# Patient Record
Sex: Male | Born: 1990 | Race: White | Hispanic: No | Marital: Single | Smoking: Former smoker
Health system: Southern US, Community
[De-identification: ages and names within clinical notes are randomized; demographics above are authoritative.]

## PROBLEM LIST (undated history)

## (undated) DIAGNOSIS — T7840XA Allergy, unspecified, initial encounter: Secondary | ICD-10-CM

## (undated) HISTORY — DX: Allergy, unspecified, initial encounter: T78.40XA

---

## 2011-06-25 ENCOUNTER — Encounter (HOSPITAL_COMMUNITY): Payer: Self-pay | Admitting: *Deleted

## 2011-06-25 ENCOUNTER — Emergency Department (HOSPITAL_COMMUNITY)
Admission: EM | Admit: 2011-06-25 | Discharge: 2011-06-25 | Disposition: A | Discharge status: Home / Self Care | Payer: 59 | Attending: Emergency Medicine | Admitting: Emergency Medicine

## 2011-06-25 DIAGNOSIS — R197 Diarrhea, unspecified: Secondary | ICD-10-CM | POA: Insufficient documentation

## 2011-06-25 DIAGNOSIS — R109 Unspecified abdominal pain: Secondary | ICD-10-CM | POA: Insufficient documentation

## 2011-06-25 LAB — URINALYSIS, ROUTINE W REFLEX MICROSCOPIC
Hgb urine dipstick: NEGATIVE
Leukocytes, UA: NEGATIVE
Protein, ur: NEGATIVE mg/dL
Urobilinogen, UA: 0.2 mg/dL (ref 0.0–1.0)

## 2011-06-25 LAB — BASIC METABOLIC PANEL
CO2: 28 mEq/L (ref 19–32)
Calcium: 9.4 mg/dL (ref 8.4–10.5)
GFR calc Af Amer: >90 mL/min (ref >90)
GFR calc non Af Amer: >90 mL/min (ref >90)
Sodium: 142 mEq/L (ref 135–145)

## 2011-06-25 LAB — DIFFERENTIAL
Basophils Absolute: 0 10*3/uL (ref 0.0–0.1)
Eosinophils Absolute: 0.1 10*3/uL (ref 0.0–0.7)
Eosinophils Relative: 1 % (ref 0–5)
Lymphocytes Relative: 13 % (ref 12–46)

## 2011-06-25 LAB — CBC
MCV: 90.6 fL (ref 78.0–100.0)
Platelets: 243 10*3/uL (ref 150–400)
RDW: 12.2 % (ref 11.5–15.5)
WBC: 9.5 10*3/uL (ref 4.0–10.5)

## 2011-06-25 MED ORDER — KETOROLAC TROMETHAMINE 30 MG/ML IJ SOLN
30.0000 mg | Freq: Once | INTRAMUSCULAR | Status: AC
  Administered 2011-06-25: 30 mg via INTRAVENOUS
  Filled 2011-06-25: qty 1

## 2011-06-25 MED ORDER — SODIUM CHLORIDE 0.9 % IV BOLUS (SEPSIS)
1000.0000 mL | Freq: Once | INTRAVENOUS | Status: AC
  Administered 2011-06-25: 1000 mL via INTRAVENOUS

## 2011-06-25 NOTE — Discharge Instructions (Signed)
You were seen and evaluated for your symptoms of right side pain. Your lab tests and urine study has not shown any signs for concerning cause of your symptoms. At this time your providers feel you may return home and continue to monitor your symptoms. Take Tylenol or ibuprofen for your pain. Followup with your primary care provider or return to the emergency room in 12-24 hours if you develop any worsening symptoms. Return sooner if you develop any fever, chills, sweats, persistent nausea vomiting.   Abdominal Pain Abdominal pain can be caused by many things. Your caregiver decides the seriousness of your pain by an examination and possibly blood tests and X-rays. Many cases can be observed and treated at home. Most abdominal pain is not caused by a disease and will probably improve without treatment. However, in many cases, more time must pass before a clear cause of the pain can be found. Before that point, it may not be known if you need more testing, or if hospitalization or surgery is needed. HOME CARE INSTRUCTIONS   Do not take laxatives unless directed by your caregiver.   Take pain medicine only as directed by your caregiver.   Only take over-the-counter or prescription medicines for pain, discomfort, or fever as directed by your caregiver.   Try a clear liquid diet (broth, tea, or water) for as long as directed by your caregiver. Slowly move to a bland diet as tolerated.  SEEK IMMEDIATE MEDICAL CARE IF:   The pain does not go away.   You have a fever.   You keep throwing up (vomiting).   The pain is felt only in portions of the abdomen. Pain in the right side could possibly be appendicitis. In an adult, pain in the left lower portion of the abdomen could be colitis or diverticulitis.   You pass bloody or black tarry stools.  MAKE SURE YOU:   Understand these instructions.   Will watch your condition.   Will get help right away if you are not doing well or get worse.  Document  Released: 11/11/2004 Document Revised: 01/21/2011 Document Reviewed: 09/20/2007 [facility] Patient Information 2012 [place], [place].    RESOURCE GUIDE  Dental Problems  Patients with Medicaid: [facility] [phone]                                           [phone] [address] [employer] [address] Phone:  [phone]                                                  Phone:  [phone]  If unable to pay or uninsured, contact:  [facility] or [facility] to become qualified for the adult dental clinic.  Chronic Pain Problems Contact [facility]  [phone] Patients need to be referred by their primary care doctor.  Insufficient Money for Medicine Contact [employer]:  call "211" or [facility] 6706452907.  No Primary Care Doctor Call [facility]  [phone] Other agencies that provide inexpensive medical care    [facility]  [phone]  [facility]  [phone]    [facility]  [phone]    [facility]  [phone]    [facility]  [phone]    [facility]  [phone]  Psychological [facility]  [phone] [facility]  [phone] [facility]   [phone] (emergency services [phone])  Substance Abuse Resources [facility]  [phone] [facility] ([phone] [facility] [place] [phone] Floydene Flock ([phone] Residential & Outpatient Substance Abuse Program  ([phone]  Abuse/Neglect [facility] Child Abuse Hotline (561)509-7692 [facility] Child Abuse Hotline 574-021-1395 (After Hours)  Emergency Shelter [facility] [phone]  M[facility] [place] [facility] [phone] [facility] [phone]  MRSA Hotline #:   8601193730    [facility] Resources  [facility]      [employer]                          [facility] [address] [place]                       [address]      [address]  Blondell Reveal Phone:  [phone]                                   Phone:  [phone]                 Phone:  [phone]  [facility] Phone:  [phone]  [facility] Child Abuse Hotline (650)416-3694 [phone] (After Hours)

## 2011-06-25 NOTE — ED Provider Notes (Signed)
History     CSN: [id number]  Arrival date & time 06/25/11  1909   First MD Initiated Contact with Patient 06/25/11 2006      Chief Complaint  Patient presents with  . Flank Pain    HPI  History provided by the patient. Patient is a lovely 21 year old male with history of seasonal allergies who presents with complaints of acute onset right side and flank pains approximately 4 hours prior to arrival. Pain is sharp and persistent. There is some waxing and waning components. Patient denies any significant aggravating or alleviating factors. Pain is not change significantly with movements. He does report some pain when he jumps vigorously. No pain with walking. No pain with eating or drinking. Pain is mild to moderate. Patient has not taken any medicines for his pain. Patient does report one loose soft bowel movement this morning. He denies any nausea vomiting. Denies any fever, chills, sweats. Patient has been eating normally throughout the day. Patient does have family history of kidney stones but no personal history of stones. He denies any dysuria, hematuria, urinary frequency, penile discharge.   History reviewed. No pertinent past medical history.  History reviewed. No pertinent past surgical history.  History reviewed. No pertinent family history.  History  Substance Use Topics  . Smoking status: Current Some Day Smoker  . Smokeless tobacco: Not on file  . Alcohol Use: Yes     fewtimes a month      Review of Systems  Constitutional: Negative for fever and chills.  Respiratory: Negative for shortness of breath.   Cardiovascular: Negative for chest pain.  Gastrointestinal: Positive for abdominal pain and diarrhea. Negative for nausea, vomiting and constipation.  Genitourinary: Positive for flank pain. Negative for dysuria, frequency, hematuria, discharge, penile swelling, scrotal swelling, genital sores, penile pain and testicular pain.  Skin: Negative for rash.     Allergies  Review of patient's allergies indicates no known allergies.  Home Medications   Current Outpatient Rx  Name Route Sig Dispense Refill  . CETIRIZINE HCL 10 MG PO TABS Oral Take 10 mg by mouth daily.    Marland Kitchen FLUTICASONE PROPIONATE 50 MCG/ACT NA SUSP Nasal Place 2 sprays into the nose daily.    . TETRAHYDROZOLINE HCL 0.05 % OP SOLN Both Eyes Place 1 drop into both eyes 2 (two) times daily.      BP 157/81  Pulse 85  Temp(Src) 99 F (37.2 C) (Oral)  Resp 16  SpO2 99%  Physical Exam  Nursing note and vitals reviewed. Constitutional: He is oriented to person, place, and time. He appears well-developed and well-nourished. No distress.  HENT:  Head: Normocephalic and atraumatic.  Cardiovascular: Normal rate and regular rhythm.   Pulmonary/Chest: Effort normal and breath sounds normal. No respiratory distress. He has no wheezes. He has no rales. He exhibits no tenderness.  Abdominal: Soft. There is no hepatosplenomegaly. There is no rebound, no guarding, no CVA tenderness, no tenderness at McBurney's point and negative Murphy's sign.    Musculoskeletal:       Cervical back: Normal.       Thoracic back: Normal.       Lumbar back: Normal.  Neurological: He is alert and oriented to person, place, and time.  Skin: Skin is warm.  Psychiatric: He has a normal mood and affect. His behavior is normal.    ED Course  Procedures    Results for orders placed during the hospital encounter of 06/25/11  URINALYSIS, ROUTINE W REFLEX MICROSCOPIC  Component Value Range   Color, Urine YELLOW  YELLOW    APPearance CLEAR  CLEAR    Specific Gravity, Urine 1.014  1.005 - 1.030    pH 7.5  5.0 - 8.0    Glucose, UA NEGATIVE  NEGATIVE (mg/dL)   Hgb urine dipstick NEGATIVE  NEGATIVE    Bilirubin Urine NEGATIVE  NEGATIVE    Ketones, ur NEGATIVE  NEGATIVE (mg/dL)   Protein, ur NEGATIVE  NEGATIVE (mg/dL)   Urobilinogen, UA 0.2  0.0 - 1.0 (mg/dL)   Nitrite NEGATIVE  NEGATIVE     Leukocytes, UA NEGATIVE  NEGATIVE   CBC      Component Value Range   WBC 9.5  4.0 - 10.5 (K/uL)   RBC 4.66  4.22 - 5.81 (MIL/uL)   Hemoglobin 14.6  13.0 - 17.0 (g/dL)   HCT 40.9  81.1 - 91.4 (%)   MCV 90.6  78.0 - 100.0 (fL)   MCH 31.3  26.0 - 34.0 (pg)   MCHC 34.6  30.0 - 36.0 (g/dL)   RDW 78.2  95.6 - 21.3 (%)   Platelets 243  150 - 400 (K/uL)  DIFFERENTIAL      Component Value Range   Neutrophils Relative 80 (*) 43 - 77 (%)   Neutro Abs 7.6  1.7 - 7.7 (K/uL)   Lymphocytes Relative 13  12 - 46 (%)   Lymphs Abs 1.2  0.7 - 4.0 (K/uL)   Monocytes Relative 7  3 - 12 (%)   Monocytes Absolute 0.6  0.1 - 1.0 (K/uL)   Eosinophils Relative 1  0 - 5 (%)   Eosinophils Absolute 0.1  0.0 - 0.7 (K/uL)   Basophils Relative 0  0 - 1 (%)   Basophils Absolute 0.0  0.0 - 0.1 (K/uL)  BASIC METABOLIC PANEL      Component Value Range   Sodium 142  135 - 145 (mEq/L)   Potassium 3.9  3.5 - 5.1 (mEq/L)   Chloride 105  96 - 112 (mEq/L)   CO2 28  19 - 32 (mEq/L)   Glucose, Bld 115 (*) 70 - 99 (mg/dL)   BUN 11  6 - 23 (mg/dL)   Creatinine, Ser 0.86  0.50 - 1.35 (mg/dL)   Calcium 9.4  8.4 - 57.8 (mg/dL)   GFR calc non Af Amer >90  >90 (mL/min)   GFR calc Af Amer >90  >90 (mL/min)         1. Flank pain       MDM  8:05 p.m. patient seen and evaluated. Patient no acute distress.   Patient feeling better after fluids and Toradol. No significant pains at this time. Reexam of the abdomen shows a soft benign exam. There are no peritoneal signs. Patient with normal WBC, no fever, no nausea vomiting or decreased appetite. No hematuria, dysuria signs for UTI. At this time I discussed findings with patient. Will have him return home with close followup and monitoring of symptoms. Patient given strict return precautions.     Angus Seller, [place] 06/25/11 2206

## 2011-06-25 NOTE — ED Notes (Signed)
Pt states R flank/abdominal pain starting x 3 hrs ago. Pt states took extra allergy pill today. Pt states lifted weights yesterday and can have physically demanding work. Pt has taken no medication for pain.

## 2011-06-26 NOTE — ED Provider Notes (Signed)
Medical screening examination/treatment/procedure(s) were performed by non-physician practitioner and as supervising physician I was immediately available for consultation/collaboration. Devoria Albe, MD, Armando Gang   Ward Givens, MD 06/26/11 [phone]

## 2014-06-27 ENCOUNTER — Ambulatory Visit (INDEPENDENT_AMBULATORY_CARE_PROVIDER_SITE_OTHER): Payer: Managed Care, Other (non HMO) | Admitting: Emergency Medicine

## 2014-06-27 ENCOUNTER — Ambulatory Visit (INDEPENDENT_AMBULATORY_CARE_PROVIDER_SITE_OTHER): Payer: Managed Care, Other (non HMO)

## 2014-06-27 VITALS — BP 120/78 | HR 76 | Temp 98.4°F | Resp 18 | Ht 72.0 in | Wt 169.0 lb

## 2014-06-27 DIAGNOSIS — M4120 Other idiopathic scoliosis, site unspecified: Secondary | ICD-10-CM | POA: Diagnosis not present

## 2014-06-27 DIAGNOSIS — M546 Pain in thoracic spine: Secondary | ICD-10-CM

## 2014-06-27 MED ORDER — MELOXICAM 15 MG PO TABS: 15.0000 mg | ORAL_TABLET | Freq: Every day | ORAL | Status: AC

## 2014-06-27 MED ORDER — CYCLOBENZAPRINE HCL 10 MG PO TABS: 10.0000 mg | ORAL_TABLET | Freq: Three times a day (TID) | ORAL | Status: AC | PRN

## 2014-06-27 NOTE — Patient Instructions (Signed)
Ice your back 3-4 times per day for 15 minutes.  Please insure that you use a barrier between your skin and the ice.  Before stretching.  You will heat the area, to warm up the musculature.  Then you will ice again afterwards.  Please take the medication as prescribed.  You will not use NSAID's (i.e. ibuprofen, naproxen, etc.).  You may take acetaminophen with this medication.  Please choose 3-4 stretches on this sheet below.  Do a specific stretch 8 times.  If there is a hold, hold for 15 seconds.    Mid-Back Strain with Rehab  A strain is an injury in which a tendon or muscle is torn. The muscles and tendons of the mid-back are vulnerable to strains. However, these muscles and tendons are very strong and require a great force to be injured. The muscles of the mid-back are responsible for stabilizing the spinal column, as well as spinal twisting (rotation). Strains are classified into three categories. Grade 1 strains cause pain, but the tendon is not lengthened. Grade 2 strains include a lengthened ligament, due to the ligament being stretched or partially ruptured. With grade 2 strains there is still function, although the function may be decreased. Grade 3 strains involve a complete tear of the tendon or muscle, and function is usually impaired. SYMPTOMS   Pain in the middle of the back.  Pain that may affect only one side, and is worse with movement.  Muscle spasms, and often swelling in the back.  Loss of strength of the back muscles.  Crackling sound (crepitation) when the muscles are touched. CAUSES  Mid-back strains occur when a force is placed on the muscles or tendons that is greater than they can handle. Common causes of injury include:  Ongoing overuse of the muscle-tendon units in the middle back, usually from incorrect body posture.  A single violent injury or force applied to the back. RISK INCREASES WITH:  Sports that involve twisting forces on the spine or a lot of bending  at the waist (football, rugby, weightlifting, bowling, golf, tennis, speed skating, racquetball, swimming, running, gymnastics, diving).  Poor strength and flexibility.  Failure to warm up properly before activity.  Family history of low back pain or disk disorders.  Previous back injury or surgery (especially fusion). PREVENTION  Learn and use proper sports technique.  Warm up and stretch properly before activity.  Allow for adequate recovery between workouts.  Maintain physical fitness:  Strength, flexibility, and endurance.  Cardiovascular fitness. PROGNOSIS  If treated properly, mid-back strains usually heal within 6 weeks. RELATED COMPLICATIONS   Frequently recurring symptoms, resulting in a chronic problem. Properly treating the problem the first time decreases frequency of recurrence.  Chronic inflammation, scarring, and partial muscle-tendon tear.  Delayed healing or resolution of symptoms, especially if activity is resumed too soon.  Prolonged disability. TREATMENT Treatment first involves the use of ice and medicine, to reduce pain and inflammation. As the pain begins to subside, you may begin strengthening and stretching exercises to improve body posture and sport technique. These exercises may be performed at home or with a therapist. Severe injuries may require referral to a therapist for further evaluation and treatment, such as ultrasound. Corticosteroid injections may be given to help reduce inflammation. Biofeedback (watching monitors of your body processes) and psychotherapy may also be prescribed. Prolonged bed rest is felt to do more harm than good. Massage may help break the muscle spasms. Sometimes, an injection of cortisone, with or without local  anesthetics, may be given to help relieve the pain and spasms. MEDICATION   If pain medicine is needed, nonsteroidal anti-inflammatory medicines (aspirin and ibuprofen), or other minor pain relievers  (acetaminophen), are often advised.  Do not take pain medicine for 7 days before surgery.  Prescription pain relievers may be given, if your caregiver thinks they are needed. Use only as directed and only as much as you need.  Ointments applied to the skin may be helpful.  Corticosteroid injections may be given by your caregiver. These injections should be reserved for the most serious cases, because they may only be given a certain number of times. HEAT AND COLD:   Cold treatment (icing) should be applied for 10 to 15 minutes every 2 to 3 hours for inflammation and pain, and immediately after activity that aggravates your symptoms. Use ice packs or an ice massage.  Heat treatment may be used before performing stretching and strengthening activities prescribed by your caregiver, physical therapist, or athletic trainer. Use a heat pack or a warm water soak. SEEK IMMEDIATE MEDICAL CARE IF:  Symptoms get worse or do not improve in 2 to 4 weeks, despite treatment.  You develop numbness, weakness, or loss of bowel or bladder function.  New, unexplained symptoms develop. (Drugs used in treatment may produce side effects.) EXERCISES RANGE OF MOTION (ROM) AND STRETCHING EXERCISES - Mid-Back Strain These exercises may help you when beginning to rehabilitate your injury. In order to successfully resolve your symptoms, you must improve your posture. These exercises are designed to help reduce the forward-head and rounded-shoulder posture which contributes to this condition. Your symptoms may resolve with or without further involvement from your physician, physical therapist or athletic trainer. While completing these exercises, remember:   Restoring tissue flexibility helps normal motion to return to the joints. This allows healthier, less painful movement and activity.  An effective stretch should be held for at least 30 seconds.  A stretch should never be painful. You should only feel a gentle  lengthening or release in the stretched tissue. STRETCH - Axial Extension  Stand or sit on a firm surface. Assume a good posture: chest up, shoulders drawn back, stomach muscles slightly tense, knees unlocked (if standing) and feet hip width apart.  Slowly retract your chin, so your head slides back and your chin slightly lowers. Continue to look straight ahead.  You should feel a gentle stretch in the back of your head. Be certain not to feel an aggressive stretch since this can cause headaches later.  Hold for __________ seconds. Repeat __________ times. Complete this exercise __________ times per day. RANGE OF MOTION- Upper Thoracic Extension  Sit on a firm chair with a high back. Assume a good posture: chest up, shoulders drawn back, abdominal muscles slightly tense, and feet hip width apart. Place a small pillow or folded towel in the curve of your lower back, if you are having difficulty maintaining good posture.  Gently brace your neck with your hands, allowing your arms to rest on your chest.  Continue to support your neck and slowly extend your back over the chair. You will feel a stretch across your upper back.  Hold __________ seconds. Slowly return to the starting position. Repeat __________ times. Complete this exercise __________ times per day. RANGE OF MOTION- Mid-Thoracic Extension  Roll a towel so that it is about 4 inches in diameter.  Position the towel lengthwise. Lay on the towel so that your spine, but not your shoulder blades, are  supported.  You should feel your mid-back arching toward the floor. To increase the stretch, extend your arms away from your body.  Hold for __________ seconds. Repeat exercise __________ times, __________ times per day. STRENGTHENING EXERCISES - Mid-Back Strain These exercises may help you when beginning to rehabilitate your injury. They may resolve your symptoms with or without further involvement from your physician, physical  therapist or athletic trainer. While completing these exercises, remember:   Muscles can gain both the endurance and the strength needed for everyday activities through controlled exercises.  Complete these exercises as instructed by your physician, physical therapist or athletic trainer. Increase the resistance and repetitions only as guided by your caregiver.  You may experience muscle soreness or fatigue, but the pain or discomfort you are trying to eliminate should never worsen during these exercises. If this pain does worsen, stop and make certain you are following the directions exactly. If the pain is still present after adjustments, discontinue the exercise until you can discuss the trouble with your caregiver. STRENGTHENING - Quadruped, Opposite UE/LE Lift  Assume a hands and knees position on a firm surface. Keep your hands under your shoulders and your knees under your hips. You may place padding under your knees for comfort.  Find your neutral spine and gently tense your abdominal muscles so that you can maintain this position. Your shoulders and hips should form a rectangle that is parallel with the floor and is not twisted.  Keeping your trunk steady, lift your right hand no higher than your shoulder and then your left leg no higher than your hip. Make sure you are not holding your breath. Hold this position __________ seconds.  Continuing to keep your abdominal muscles tense and your back steady, slowly return to your starting position. Repeat with the opposite arm and leg. Repeat __________ times. Complete this exercise __________ times per day.  STRENGTH - Shoulder Extensors  Secure a rubber exercise band or tubing to a fixed object (table, pole) so that it is at the height of your shoulders when you are either standing, or sitting on a firm armless chair.  With a thumbs-up grip, grasp an end of the band in each hand. Straighten your elbows and lift your hands straight in front  of you at shoulder height. Step back away from the secured end of band, until it becomes tense.  Squeezing your shoulder blades together, pull your hands down to the sides of your thighs. Do not allow your hands to go behind you.  Hold for __________ seconds. Slowly ease the tension on the band, as you reverse the directions and return to the starting position. Repeat __________ times. Complete this exercise __________ times per day.  STRENGTH - Horizontal Abductors Choose one of the two positions to complete this exercise. Prone: lying on stomach:  Lie on your stomach on a firm surface so that your right / left arm overhangs the edge. Rest your forehead on your opposite forearm. With your palm facing the floor and your elbow straight, hold a __________ weight in your hand.  Squeeze your right / left shoulder blade to your mid-back spine and then slowly raise your arm to the height of the bed.  Hold for __________ seconds. Slowly reverse the directions and return to the starting position, controlling the weight as you lower your arm. Repeat __________ times. Complete this exercise __________ times per day. Standing:   Secure a rubber exercise band or tubing, so that it is at the  height of your shoulders when you are either standing, or sitting on a firm armless chair.  Grasp an end of the band in each hand and have your palms face each other. Straighten your elbows and lift your hands straight in front of you at shoulder height. Step back away from the secured end of band, until it becomes tense.  Squeeze your shoulder blades together. Keeping your elbows locked and your hands at shoulder height, spread your arms apart, forming a "T" shape with your body. Hold __________ seconds. Slowly ease the tension on the band, as you reverse the directions and return to the starting position. Repeat __________ times. Complete this exercise __________ times per day. STRENGTH - Scapular Retractors and  External Rotators, Rowing  Secure a rubber exercise band or tubing, so that it is at the height of your shoulders when you are either standing, or sitting on a firm armless chair.  With a palm-down grip, grasp an end of the band in each hand. Straighten your elbows and lift your hands straight in front of you at shoulder height. Step back away from the secured end of band, until it becomes tense.  Step 1: Squeeze your shoulder blades together. Bending your elbows, draw your hands to your chest as if you are rowing a boat. At the end of this motion, your hands and elbow should be at shoulder height and your elbows should be out to your sides.  Step 2: Rotate your shoulder to raise your hands above your head. Your forearms should be vertical and your upper arms should be horizontal.  Hold for __________ seconds. Slowly ease the tension on the band, as you reverse the directions and return to the starting position. Repeat __________ times. Complete this exercise __________ times per day.  POSTURE AND BODY MECHANICS CONSIDERATIONS - Mid-Back Strain Keeping correct posture when sitting, standing or completing your activities will reduce the stress put on different body tissues, allowing injured tissues a chance to heal and limiting painful experiences. The following are general guidelines for improved posture. Your physician or physical therapist will provide you with any instructions specific to your needs. While reading these guidelines, remember:  The exercises prescribed by your provider will help you have the flexibility and strength to maintain correct postures.  The correct posture provides the best environment for your joints to work. All of your joints have less wear and tear when properly supported by a spine with good posture. This means you will experience a healthier, less painful body.  Correct posture must be practiced with all of your activities, especially prolonged sitting and  standing. Correct posture is as important when doing repetitive low-stress activities (typing) as it is when doing a single heavy-load activity (lifting). PROPER SITTING POSTURE In order to minimize stress and discomfort on your spine, you must sit with correct posture. Sitting with good posture should be effortless for a healthy body. Returning to good posture is a gradual process. Many people can work toward this most comfortably by using various supports until they have the flexibility and strength to maintain this posture on their own. When sitting with proper posture, your ears will fall over your shoulders and your shoulders will fall over your hips. You should use the back of the chair to support your upper back. Your lower back will be in a neutral position, just slightly arched. You may place a small pillow or folded towel at the base of your low back for  support.  When working at a desk, create an environment that supports good, upright posture. Without extra support, muscles fatigue and lead to excessive strain on joints and other tissues. Keep these recommendations in mind: CHAIR:  A chair should be able to slide under your desk when your back makes contact with the back of the chair. This allows you to work closely.  The chair's height should allow your eyes to be level with the upper part of your monitor and your hands to be slightly lower than your elbows. BODY POSITION  Your feet should make contact with the floor. If this is not possible, use a foot rest.  Keep your ears over your shoulders. This will reduce stress on your neck and lower back. INCORRECT SITTING POSTURES If you are feeling tired and unable to assume a healthy sitting posture, do not slouch or slump. This puts excessive strain on your back tissues, causing more damage and pain. Healthier options include:  Using more support, like a lumbar pillow.  Switching tasks to something that requires you to be upright or  walking.  Talking a brief walk.  Lying down to rest in a neutral-spine position. CORRECT STANDING POSTURES Proper standing posture should be assumed with all daily activities, even if they only take a few moments, like when brushing your teeth. As in sitting, your ears should fall over your shoulders and your shoulders should fall over your hips. You should keep a slight tension in your abdominal muscles to brace your spine. Your tailbone should point down to the ground, not behind your body, resulting in an over-extended swayback posture.  INCORRECT STANDING POSTURES Common incorrect standing postures include a forward head, locked knees, and an excessive swayback. WALKING Walk with an upright posture. Your ears, shoulders and hips should all line-up. CORRECT LIFTING TECHNIQUES DO :   Assume a wide stance. This will provide you more stability and the opportunity to get as close as possible to the object which you are lifting.  Tense your abdominals to brace your spine. Bend at the knees and hips. Keeping your back locked in a neutral-spine position, lift using your leg muscles. Lift with your legs, keeping your back straight.  Test the weight of unknown objects before attempting to lift them.  Try to keep your elbows locked down at your sides in order get the best strength from your shoulders when carrying an object.  Always ask for help when lifting heavy or awkward objects. INCORRECT LIFTING TECHNIQUES DO NOT:   Lock your knees when lifting, even if it is a small object.  Bend and twist. Pivot at your feet or move your feet when needing to change directions.  Assume that you can safely pick up even a paperclip without proper posture. Document Released: 02/01/2005 Document Revised: 06/18/2013 Document Reviewed: 05/16/2008 [facility] Patient Information 2015 [place], [place]. This information is not intended to replace advice given to you by your health care provider. Make sure you  discuss any questions you have with your health care provider.

## 2014-06-27 NOTE — Progress Notes (Signed)
[facility] [address], [place] [address] [phone]  Date:  06/27/2014   Name:  Phillip Lewis   DOB:  05/06/1990   MRN:  [id number]  PCP:  No primary care provider on file.    Chief Complaint: Back Pain   History of Present Illness:  Phillip Lewis is a 24 y.o. very pleasant male patient who presents with the following:  Phillip Lewis complains of sharp back pain at right side of mid back, following a coughing fit. He rates the pain 7-8/10 at the start, which relieved to a 5/10.  It is sharp mid back, without radiating to other regions.  There is no numbness or tingling.  He has no dyspnea, though pain is aggravated by deep inhalation.  He has no dysuria, hematuria, or frequency.  Pain aggravated by deep breathing, walking, standing, and sitting down.  It feels better laying supine.   He does not exercise.  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  He reports longstanding intermittent back pain for the last 8 years.  This was followed by his pcp, [facility], but he states he has never had any imaging.  He generally treats his pain with gentle stretches, and eventually pain will resolve.    There are no active problems to display for this patient.   Past Medical History  Diagnosis Date  . Allergy     History reviewed. No pertinent past surgical history.  History  Substance Use Topics  . Smoking status: Former Games developermoker  . Smokeless tobacco: Not on file  . Alcohol Use: 1.8 - 3.0 oz/week    3-5 Standard drinks or equivalent per week     Comment: fewtimes a month    History reviewed. No pertinent family history.  No Known Allergies  Medication list has been reviewed and updated.  Current Outpatient Prescriptions on File Prior to Visit  Medication Sig Dispense Refill  . cetirizine (ZYRTEC) 10 MG tablet Take 10 mg by mouth daily.    . fluticasone (FLONASE) 50 MCG/ACT nasal spray Place 2 sprays into the nose daily.    Marland Kitchen. tetrahydrozoline  0.05 % ophthalmic solution Place 1 drop into both eyes 2 (two) times daily.     No current facility-administered medications on file prior to visit.    Review of Systems: ROS otherwise unremarkable unless listed above.   Physical Examination: Filed Vitals:   06/27/14 1501  BP: 120/78  Pulse: 76  Temp: 98.4 F (36.9 C)  Resp: 18   Filed Vitals:   06/27/14 1501  Height: 6' (1.829 m)  Weight: 169 lb (76.658 kg)   Body mass index is 22.92 kg/(m^2). Ideal Body Weight: Weight in (lb) to have BMI = 25: 183.9  Physical Exam  Constitutional: He is oriented to person, place, and time. He appears well-developed and well-nourished. No distress.  HENT:  Head: Normocephalic and atraumatic.  Cardiovascular: Normal rate, regular rhythm, normal heart sounds and intact distal pulses.   Pulmonary/Chest: Effort normal and breath sounds normal. No respiratory distress. He has no wheezes.  Abdominal: There is no CVA tenderness.  Musculoskeletal: He exhibits no edema.  Back features thoracic curvature with right sided muscle protruding slightly, at the seated position.  He has no spinous tenderness, no redness or swelling.  He has no tenderness to neighboring musculature with deep palpation.  Forward flexion decreased to 30 degrees.  Lateral deviation normal bilaterally.  Horizontal rotation incites right sided musculature pain  with turning moreso left, though pain with right as well.  Normal lowerextremity movement and strength.  Negative straight leg raise test.    Neurological: He is alert and oriented to person, place, and time.  Skin: Skin is warm and dry. He is not diaphoretic.  Psychiatric: He has a normal mood and affect. His behavior is normal.    [facility] reading (PRIMARY) by  Dr. Dareen PianoAnderson: Midthoracic scoliosis.  Assessment and Plan: 24 year old male is here today complaining of mid back pain.  This is a hx that has carried on since highschool.  It was suggested by Dr. Dareen PianoAnderson, that he  have consult with a scoliosis center, which I think would be appreciated at this time.   -Treating today with muscle relaxant and NSAIDs, advised to ice 3-4x per day with ice.  Heat before stretch and ice following.  Given handout and discussion of stretch exercises for the back.  Right-sided thoracic back pain - Plan: DG Thoracic Spine 2 View, AMB referral to orthopedics, cyclobenzaprine (FLEXERIL) 10 MG tablet, meloxicam (MOBIC) 15 MG tablet  Idiopathic scoliosis  Trena PlattStephanie Jacori Mulrooney, PA-C [facility] 5/15/20166:37 PM

## 2014-07-02 NOTE — Progress Notes (Signed)
Denies other complaint or health concern today.

## 2016-04-21 IMAGING — CR DG THORACIC SPINE 2V
2 series · 2 of 2 positions shown · non-contrast
Comparison: None.

CLINICAL DATA: Right-sided chest pain and back pain

EXAM:
THORACIC SPINE - 2 VIEW

[AP]
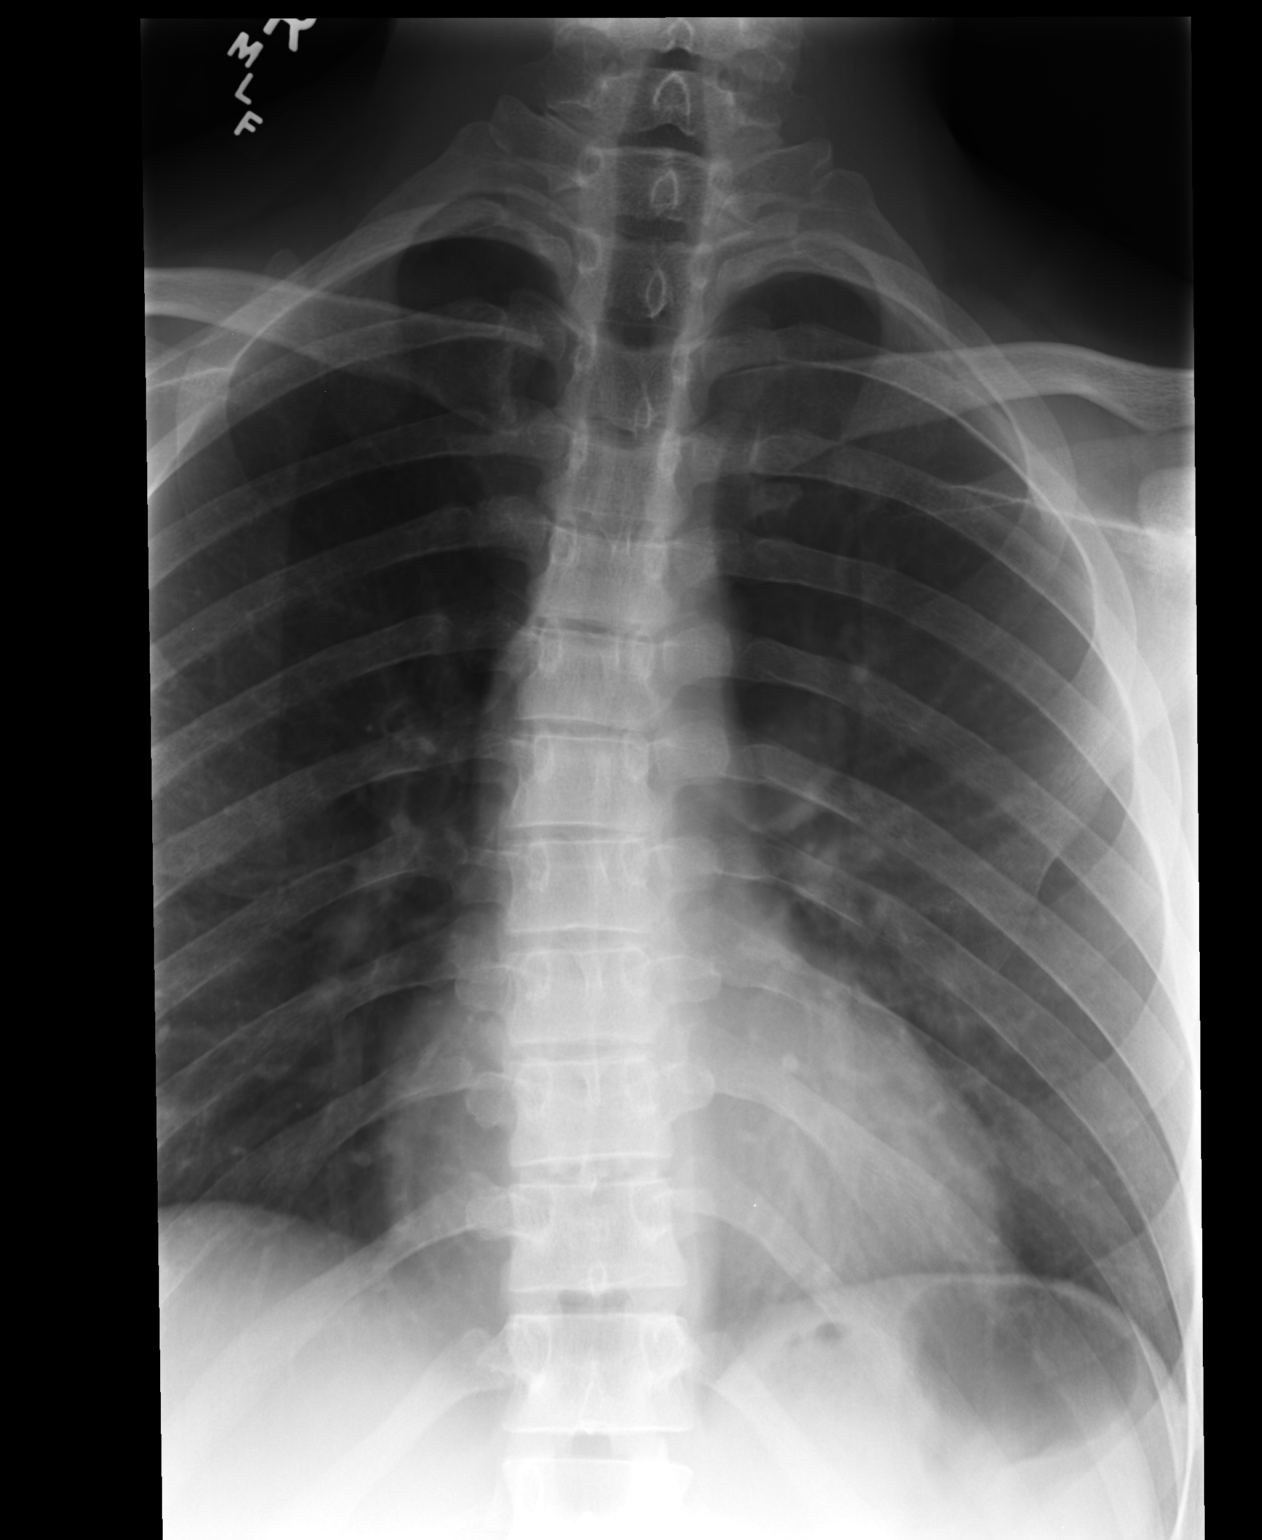

[lateral]
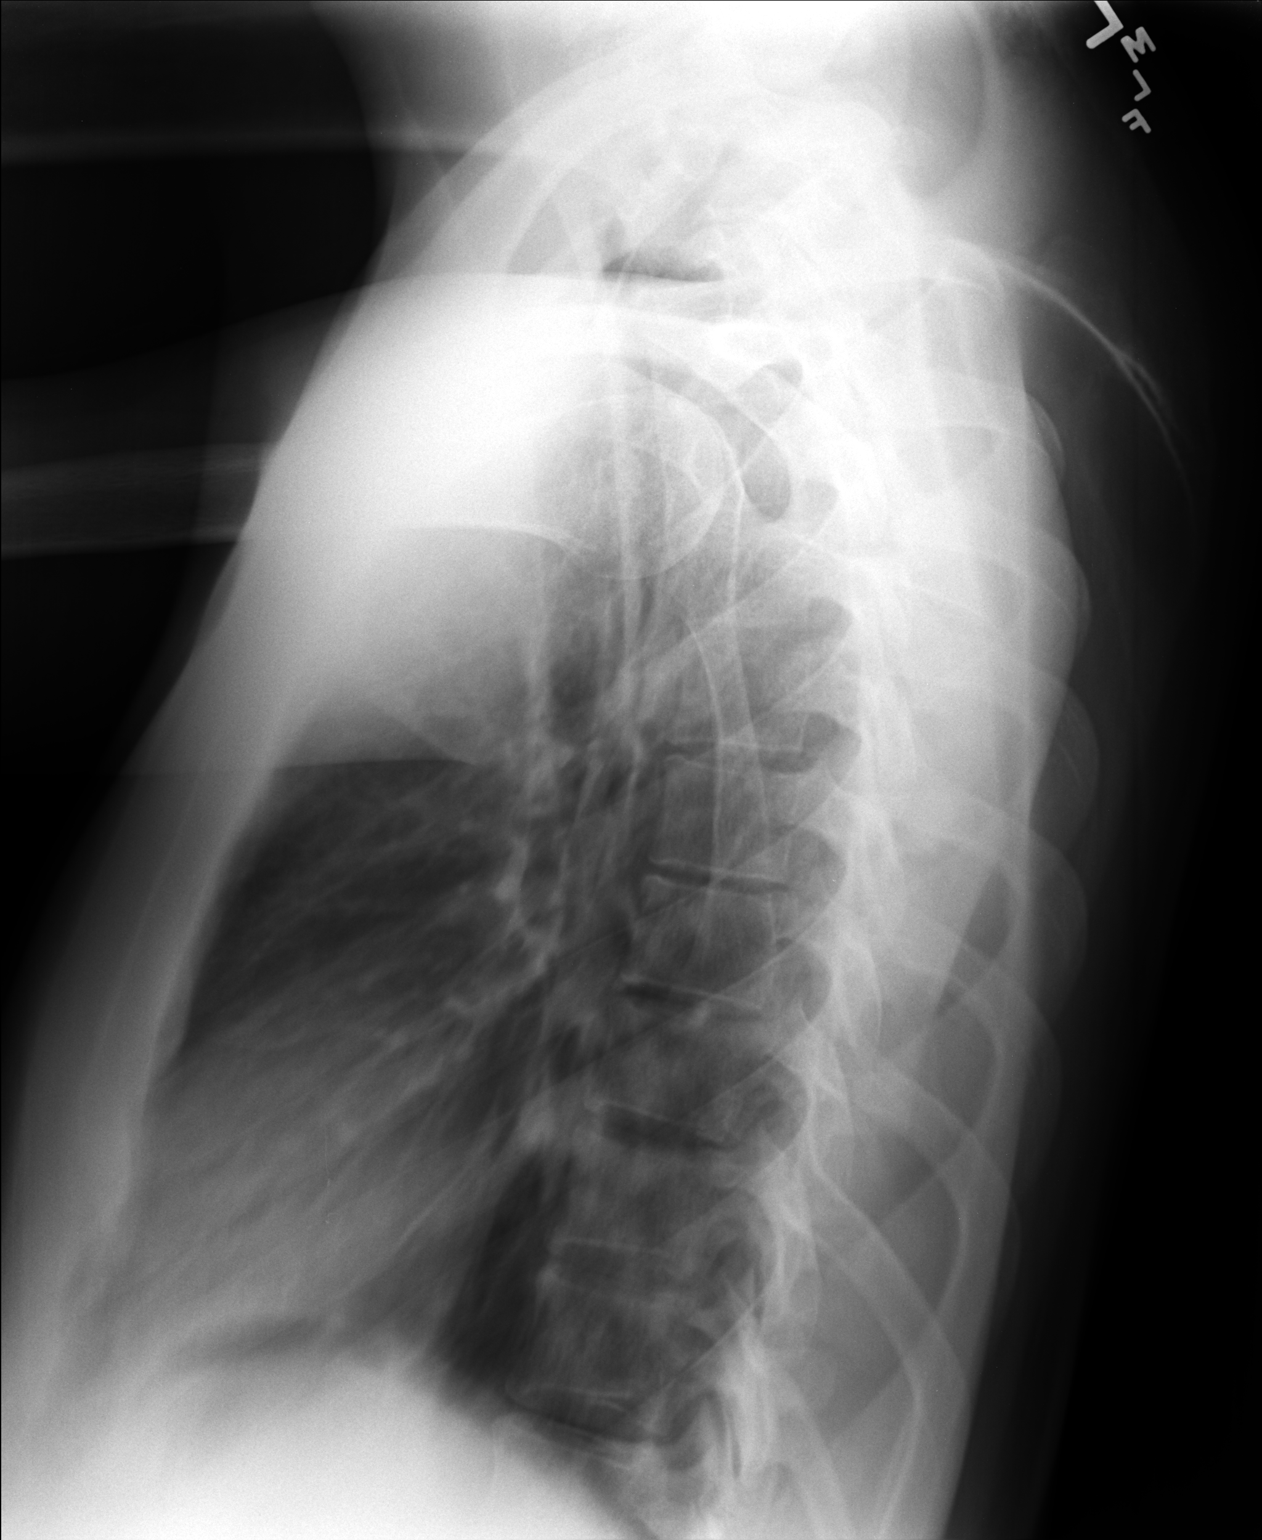

[2 of 2 positions shown; findings below may reference images not displayed]

FINDINGS: Vertebral body height is well maintained. The pedicles are within
normal limits. Mild scoliosis is noted concave to the left centered
in the mid thoracic spine. No rib abnormality is noted
IMPRESSION: Mild scoliosis without acute abnormality.
